# Patient Record
Sex: Male | Born: 1990 | Race: Black or African American | Hispanic: No | Marital: Single | Smoking: Never smoker
Health system: Southern US, Community
[De-identification: ages and names within clinical notes are randomized; demographics above are authoritative.]

## PROBLEM LIST (undated history)

## (undated) HISTORY — PX: OTHER SURGICAL HISTORY: SHX169

---

## 2015-08-05 ENCOUNTER — Emergency Department (HOSPITAL_BASED_OUTPATIENT_CLINIC_OR_DEPARTMENT_OTHER): Payer: No Typology Code available for payment source

## 2015-08-05 ENCOUNTER — Emergency Department (HOSPITAL_BASED_OUTPATIENT_CLINIC_OR_DEPARTMENT_OTHER)
Admission: EM | Admit: 2015-08-05 | Discharge: 2015-08-05 | Disposition: A | Discharge status: Home / Self Care | Payer: No Typology Code available for payment source | Attending: Emergency Medicine | Admitting: Emergency Medicine

## 2015-08-05 ENCOUNTER — Encounter (HOSPITAL_BASED_OUTPATIENT_CLINIC_OR_DEPARTMENT_OTHER): Payer: Self-pay

## 2015-08-05 DIAGNOSIS — M25511 Pain in right shoulder: Secondary | ICD-10-CM | POA: Diagnosis present

## 2015-08-05 DIAGNOSIS — Y939 Activity, unspecified: Secondary | ICD-10-CM | POA: Diagnosis not present

## 2015-08-05 DIAGNOSIS — Y999 Unspecified external cause status: Secondary | ICD-10-CM | POA: Diagnosis not present

## 2015-08-05 DIAGNOSIS — R51 Headache: Secondary | ICD-10-CM | POA: Diagnosis not present

## 2015-08-05 DIAGNOSIS — Y92410 Unspecified street and highway as the place of occurrence of the external cause: Secondary | ICD-10-CM | POA: Insufficient documentation

## 2015-08-05 DIAGNOSIS — S46911A Strain of unspecified muscle, fascia and tendon at shoulder and upper arm level, right arm, initial encounter: Secondary | ICD-10-CM | POA: Insufficient documentation

## 2015-08-05 MED ORDER — IBUPROFEN 400 MG PO TABS
400.0000 mg | ORAL_TABLET | Freq: Once | ORAL | Status: AC
  Administered 2015-08-05: 400 mg via ORAL
  Filled 2015-08-05: qty 1
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
## 2015-08-05 NOTE — ED Notes (Signed)
MD at bedside. 
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
